# Patient Record
Sex: Male | Born: 1990 | Race: Black or African American | Hispanic: No | Marital: Single | Smoking: Current every day smoker
Health system: Southern US, Community
[De-identification: ages and names within clinical notes are randomized; demographics above are authoritative.]

## PROBLEM LIST (undated history)

## (undated) DIAGNOSIS — J45909 Unspecified asthma, uncomplicated: Secondary | ICD-10-CM

## (undated) HISTORY — PX: TONSILLECTOMY: SUR1361

---

## 2014-06-30 ENCOUNTER — Encounter (HOSPITAL_COMMUNITY): Payer: Self-pay | Admitting: Emergency Medicine

## 2014-06-30 ENCOUNTER — Emergency Department (HOSPITAL_COMMUNITY)
Admission: EM | Admit: 2014-06-30 | Discharge: 2014-06-30 | Disposition: A | Discharge status: Home / Self Care | Payer: BLUE CROSS/BLUE SHIELD | Source: Home / Self Care | Attending: Emergency Medicine | Admitting: Emergency Medicine

## 2014-06-30 DIAGNOSIS — A084 Viral intestinal infection, unspecified: Secondary | ICD-10-CM | POA: Diagnosis not present

## 2014-06-30 HISTORY — DX: Unspecified asthma, uncomplicated: J45.909

## 2014-06-30 MED ORDER — ONDANSETRON HCL 4 MG PO TABS: 4.0000 mg | ORAL_TABLET | Freq: Four times a day (QID) | ORAL | Status: AC

## 2014-06-30 NOTE — ED Notes (Signed)
Pt has been suffering from N&V and diarrhea since yesterday afternoon.  He states he is not able to keep any food down at all.  He also reports chills even in the heat.

## 2014-06-30 NOTE — Discharge Instructions (Signed)
Viral Gastroenteritis Pedialyte in small amounts frequently One or 2 Immodium AD per day if having 4-6 stools per 6 hour period Viral gastroenteritis is also called stomach flu. This illness is caused by a certain type of germ (virus). It can cause sudden watery poop (diarrhea) and throwing up (vomiting). This can cause you to lose body fluids (dehydration). This illness usually lasts for 3 to 8 days. It usually goes away on its own. HOME CARE   Drink enough fluids to keep your pee (urine) clear or pale yellow. Drink small amounts of fluids often.  Ask your doctor how to replace body fluid losses (rehydration).  Avoid:  Foods high in sugar.  Alcohol.  Bubbly (carbonated) drinks.  Tobacco.  Juice.  Caffeine drinks.  Very hot or cold fluids.  Fatty, greasy foods.  Eating too much at one time.  Dairy products until 24 to 48 hours after your watery poop stops.  You may eat foods with active cultures (probiotics). They can be found in some yogurts and supplements.  Wash your hands well to avoid spreading the illness.  Only take medicines as told by your doctor. Do not give aspirin to children. Do not take medicines for watery poop (antidiarrheals).  Ask your doctor if you should keep taking your regular medicines.  Keep all doctor visits as told. GET HELP RIGHT AWAY IF:   You cannot keep fluids down.  You do not pee at least once every 6 to 8 hours.  You are short of breath.  You see blood in your poop or throw up. This may look like coffee grounds.  You have belly (abdominal) pain that gets worse or is just in one small spot (localized).  You keep throwing up or having watery poop.  You have a fever.  The patient is a child younger than 3 months, and he or she has a fever.  The patient is a child older than 3 months, and he or she has a fever and problems that do not go away.  The patient is a child older than 3 months, and he or she has a fever and problems  that suddenly get worse.  The patient is a baby, and he or she has no tears when crying. MAKE SURE YOU:   Understand these instructions.  Will watch your condition.  Will get help right away if you are not doing well or get worse. Document Released: 08/16/2007 Document Revised: 05/22/2011 Document Reviewed: 12/14/2010 [facility] Patient Information 2015 [place], [place]. This information is not intended to replace advice given to you by your health care provider. Make sure you discuss any questions you have with your health care provider.  Food Choices to Help Relieve Diarrhea After no more vomiting. When you have diarrhea, the foods you eat and your eating habits are very important. Choosing the right foods and drinks can help relieve diarrhea. Also, because diarrhea can last up to 7 days, you need to replace lost fluids and electrolytes (such as sodium, potassium, and chloride) in order to help prevent dehydration.  WHAT GENERAL GUIDELINES DO I NEED TO FOLLOW?  Slowly drink 1 cup (8 oz) of fluid for each episode of diarrhea. If you are getting enough fluid, your urine will be clear or pale yellow.  Eat starchy foods. Some good choices include white rice, white toast, pasta, low-fiber cereal, baked potatoes (without the skin), saltine crackers, and bagels.  Avoid large servings of any cooked vegetables.  Limit fruit to two servings per  day. A serving is  cup or 1 small piece.  Choose foods with less than 2 g of fiber per serving.  Limit fats to less than 8 tsp (38 g) per day.  Avoid fried foods.  Eat foods that have probiotics in them. Probiotics can be found in certain dairy products.  Avoid foods and beverages that may increase the speed at which food moves through the stomach and intestines (gastrointestinal tract). Things to avoid include:  High-fiber foods, such as dried fruit, raw fruits and vegetables, nuts, seeds, and whole grain foods.  Spicy foods and high-fat  foods.  Foods and beverages sweetened with high-fructose corn syrup, honey, or sugar alcohols such as xylitol, sorbitol, and mannitol. WHAT FOODS ARE RECOMMENDED? Grains White rice. White, [place], or pita breads (fresh or toasted), including plain rolls, buns, or bagels. White pasta. Saltine, soda, or graham crackers. Pretzels. Low-fiber cereal. Cooked cereals made with water (such as cornmeal, farina, or cream cereals). Plain muffins. Matzo. Melba toast. Zwieback.  Vegetables Potatoes (without the skin). Strained tomato and vegetable juices. Most well-cooked and canned vegetables without seeds. Tender lettuce. Fruits Cooked or canned applesauce, apricots, cherries, fruit cocktail, grapefruit, peaches, pears, or plums. Fresh bananas, apples without skin, cherries, grapes, cantaloupe, grapefruit, peaches, oranges, or plums.  Meat and Other Protein Products Baked or boiled chicken. Eggs. Tofu. Fish. Seafood. Smooth peanut butter. Ground or well-cooked tender beef, ham, veal, lamb, pork, or poultry.  Dairy Plain yogurt, kefir, and unsweetened liquid yogurt. Lactose-free milk, buttermilk, or soy milk. Plain hard cheese. Beverages Sport drinks. Clear broths. Diluted fruit juices (except prune). Regular, caffeine-free sodas such as ginger ale. Water. Decaffeinated teas. Oral rehydration solutions. Sugar-free beverages not sweetened with sugar alcohols. Other Bouillon, broth, or soups made from recommended foods.  The items listed above may not be a complete list of recommended foods or beverages. Contact your dietitian for more options. WHAT FOODS ARE NOT RECOMMENDED? Grains Whole grain, whole wheat, bran, or rye breads, rolls, pastas, crackers, and cereals. Wild or brown rice. Cereals that contain more than 2 g of fiber per serving. Corn tortillas or taco shells. Cooked or dry oatmeal. Granola. Popcorn. Vegetables Raw vegetables. Cabbage, broccoli, Brussels sprouts, artichokes, baked beans, beet  greens, corn, kale, legumes, peas, sweet potatoes, and yams. Potato skins. Cooked spinach and cabbage. Fruits Dried fruit, including raisins and dates. Raw fruits. Stewed or dried prunes. Fresh apples with skin, apricots, mangoes, pears, raspberries, and strawberries.  Meat and Other Protein Products Chunky peanut butter. Nuts and seeds. Beans and lentils. Tomasa Blase.  Dairy High-fat cheeses. Milk, chocolate milk, and beverages made with milk, such as milk shakes. Cream. Ice cream. Sweets and Desserts Sweet rolls, doughnuts, and sweet breads. Pancakes and waffles. Fats and Oils Butter. Cream sauces. Margarine. Salad oils. Plain salad dressings. Olives. Avocados.  Beverages Caffeinated beverages (such as coffee, tea, soda, or energy drinks). Alcoholic beverages. Fruit juices with pulp. Prune juice. Soft drinks sweetened with high-fructose corn syrup or sugar alcohols. Other Coconut. Hot sauce. Chili powder. Mayonnaise. Gravy. Cream-based or milk-based soups.  The items listed above may not be a complete list of foods and beverages to avoid. Contact your dietitian for more information. WHAT SHOULD I DO IF I BECOME DEHYDRATED? Diarrhea can sometimes lead to dehydration. Signs of dehydration include dark urine and dry mouth and skin. If you think you are dehydrated, you should rehydrate with an oral rehydration solution. These solutions can be purchased at pharmacies, retail stores, or online.  Drink -1 cup (  120-240 mL) of oral rehydration solution each time you have an episode of diarrhea. If drinking this amount makes your diarrhea worse, try drinking smaller amounts more often. For example, drink 1-3 tsp (5-15 mL) every 5-10 minutes.  A general rule for staying hydrated is to drink 1-2 L of fluid per day. Talk to your health care provider about the specific amount you should be drinking each day. Drink enough fluids to keep your urine clear or pale yellow. Document Released: 05/20/2003 Document  Revised: 03/04/2013 Document Reviewed: 01/20/2013 [facility] Patient Information 2015 [place]. This information is not intended to replace advice given to you by your health care provider. Make sure you discuss any questions you have with your health care provider.

## 2014-06-30 NOTE — ED Provider Notes (Signed)
CSN: [id number]     Arrival date & time 06/30/14  1844 History   First MD Initiated Contact with Patient 06/30/14 1936     Chief Complaint  Patient presents with  . Nausea  . Vomiting  . Diarrhea  . Chills   (Consider location/radiation/quality/duration/timing/severity/associated sxs/prior Treatment) HPI Comments: 24 year old male with developed diarrhea, nausea and vomiting yesterday afternoon at 2 PM. It is associated with occasional chills and myalgias. He felt as though he had a fever but did not measure his temperature.   Past Medical History  Diagnosis Date  . Asthma    Past Surgical History  Procedure Laterality Date  . Tonsillectomy     History reviewed. No pertinent family history. History  Substance Use Topics  . Smoking status: Current Every Day Smoker -- 0.50 packs/day    Types: Cigarettes  . Smokeless tobacco: Never Used  . Alcohol Use: 4.2 oz/week    7 Cans of beer per week    Review of Systems  Constitutional: Positive for fever, activity change, appetite change and fatigue.  HENT: Positive for postnasal drip.   Respiratory: Negative for cough and shortness of breath.   Cardiovascular: Negative for chest pain.  Gastrointestinal:       As per history of present illness  Genitourinary: Negative.   Musculoskeletal: Positive for myalgias.  Skin: Negative for rash.  Neurological: Positive for light-headedness.    Allergies  Tomato  Home Medications   Prior to Admission medications   Medication Sig Start Date End Date Taking? Authorizing Provider  Loperamide HCl (IMODIUM PO) Take by mouth.   Yes Historical Provider, MD  ondansetron (ZOFRAN) 4 MG tablet Take 1 tablet (4 mg total) by mouth every 6 (six) hours. 06/30/14   Hayden Rasmussenavid Secret Kristensen, NP   BP 128/77 mmHg  Pulse 83  Temp(Src) 99 F (37.2 C) (Oral)  Resp 16  SpO2 96% Physical Exam  Constitutional: He is oriented to person, place, and time. He appears well-developed and well-nourished. No distress.   HENT:  Mouth/Throat: No oropharyngeal exudate.  Bilateral TMs are normal Oropharynx with mild erythema and cobblestoning with clear PND  Eyes: Conjunctivae and EOM are normal.  Neck: Normal range of motion. Neck supple.  Cardiovascular: Normal rate, regular rhythm, normal heart sounds and intact distal pulses.   Pulmonary/Chest: Effort normal and breath sounds normal. No respiratory distress. He has no wheezes. He has no rales.  Abdominal: Soft. Bowel sounds are normal. He exhibits no distension and no mass. There is no rebound and no guarding.  Mild tenderness in the left lower quadrant.  Lymphadenopathy:    He has no cervical adenopathy.  Neurological: He is alert and oriented to person, place, and time.  Skin: Skin is warm and dry.  Psychiatric: He has a normal mood and affect.  Nursing note and vitals reviewed.   ED Course  Procedures (including critical care time) Labs Review Labs Reviewed - No data to display  Imaging Review No results found.   MDM   1. Viral gastroenteritis    Pedialyte in small amounts frequently One or 2 Immodium AD per day if having 4-6 stools per 6 hour period Read diarrhea and vomiting instructions   Hayden Rasmussenavid Jariyah Hackley, NP 06/30/14 1956

## 2016-10-07 ENCOUNTER — Emergency Department (HOSPITAL_COMMUNITY)
Admission: EM | Admit: 2016-10-07 | Discharge: 2016-10-07 | Disposition: A | Discharge status: Home / Self Care | Payer: BLUE CROSS/BLUE SHIELD | Attending: Emergency Medicine | Admitting: Emergency Medicine

## 2016-10-07 ENCOUNTER — Encounter (HOSPITAL_COMMUNITY): Payer: Self-pay

## 2016-10-07 DIAGNOSIS — F17210 Nicotine dependence, cigarettes, uncomplicated: Secondary | ICD-10-CM | POA: Insufficient documentation

## 2016-10-07 DIAGNOSIS — Y929 Unspecified place or not applicable: Secondary | ICD-10-CM | POA: Insufficient documentation

## 2016-10-07 DIAGNOSIS — Y999 Unspecified external cause status: Secondary | ICD-10-CM | POA: Insufficient documentation

## 2016-10-07 DIAGNOSIS — W260XXA Contact with knife, initial encounter: Secondary | ICD-10-CM | POA: Insufficient documentation

## 2016-10-07 DIAGNOSIS — S61412A Laceration without foreign body of left hand, initial encounter: Secondary | ICD-10-CM | POA: Insufficient documentation

## 2016-10-07 DIAGNOSIS — Y9389 Activity, other specified: Secondary | ICD-10-CM | POA: Insufficient documentation

## 2016-10-07 DIAGNOSIS — J45909 Unspecified asthma, uncomplicated: Secondary | ICD-10-CM | POA: Insufficient documentation

## 2016-10-07 MED ORDER — CEPHALEXIN 250 MG PO CAPS
500.0000 mg | ORAL_CAPSULE | Freq: Once | ORAL | Status: AC
  Administered 2016-10-07: 500 mg via ORAL
  Filled 2016-10-07: qty 2

## 2016-10-07 MED ORDER — CEPHALEXIN 500 MG PO CAPS: 500.0000 mg | ORAL_CAPSULE | Freq: Four times a day (QID) | ORAL | 0 refills | Status: DC

## 2016-10-07 NOTE — Discharge Instructions (Signed)
Keep wound dry and do not remove dressing for 24 hours if possible. After that, wash gently morning and night (every 12 hours) with soap and water. Use a topical antibiotic ointment and cover with a bandaid or gauze.  °  °Do NOT use rubbing alcohol or hydrogen peroxide, do not soak the area °  °Every attempt was made to remove foreign body (contaminants) from the wound.  However, there is always a chance that some may remain in the wound. This can  increase your risk of infection. °  °If you see signs of infection (warmth, redness, tenderness, pus, sharp increase in pain, fever, red streaking in the skin) immediately return to the emergency department. °  °After the wound heals fully, apply sunscreen for 6-12 months to minimize scarring.  ° °
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
## 2017-07-29 ENCOUNTER — Emergency Department
Admission: EM | Admit: 2017-07-29 | Discharge: 2017-07-29 | Disposition: A | Discharge status: Home / Self Care | Payer: BLUE CROSS/BLUE SHIELD | Attending: Emergency Medicine | Admitting: Emergency Medicine

## 2017-07-29 ENCOUNTER — Emergency Department: Payer: BLUE CROSS/BLUE SHIELD

## 2017-07-29 ENCOUNTER — Other Ambulatory Visit: Payer: Self-pay

## 2017-07-29 DIAGNOSIS — S060X0A Concussion without loss of consciousness, initial encounter: Secondary | ICD-10-CM | POA: Diagnosis not present

## 2017-07-29 DIAGNOSIS — Y9389 Activity, other specified: Secondary | ICD-10-CM | POA: Insufficient documentation

## 2017-07-29 DIAGNOSIS — J45909 Unspecified asthma, uncomplicated: Secondary | ICD-10-CM | POA: Diagnosis not present

## 2017-07-29 DIAGNOSIS — S1091XA Abrasion of unspecified part of neck, initial encounter: Secondary | ICD-10-CM | POA: Diagnosis not present

## 2017-07-29 DIAGNOSIS — F17210 Nicotine dependence, cigarettes, uncomplicated: Secondary | ICD-10-CM | POA: Diagnosis not present

## 2017-07-29 DIAGNOSIS — Z23 Encounter for immunization: Secondary | ICD-10-CM | POA: Diagnosis not present

## 2017-07-29 DIAGNOSIS — Y998 Other external cause status: Secondary | ICD-10-CM | POA: Diagnosis not present

## 2017-07-29 DIAGNOSIS — Y92410 Unspecified street and highway as the place of occurrence of the external cause: Secondary | ICD-10-CM | POA: Diagnosis not present

## 2017-07-29 DIAGNOSIS — S0990XA Unspecified injury of head, initial encounter: Secondary | ICD-10-CM | POA: Diagnosis present

## 2017-07-29 DIAGNOSIS — S80211A Abrasion, right knee, initial encounter: Secondary | ICD-10-CM | POA: Insufficient documentation

## 2017-07-29 DIAGNOSIS — T07XXXA Unspecified multiple injuries, initial encounter: Secondary | ICD-10-CM

## 2017-07-29 MED ORDER — ACETAMINOPHEN 500 MG PO TABS
1000.0000 mg | ORAL_TABLET | Freq: Once | ORAL | Status: AC
  Administered 2017-07-29: 1000 mg via ORAL
  Filled 2017-07-29: qty 2

## 2017-07-29 MED ORDER — TETANUS-DIPHTH-ACELL PERTUSSIS 5-2.5-18.5 LF-MCG/0.5 IM SUSP
0.5000 mL | Freq: Once | INTRAMUSCULAR | Status: AC
  Administered 2017-07-29: 0.5 mL via INTRAMUSCULAR
  Filled 2017-07-29: qty 0.5

## 2017-07-29 MED ORDER — MORPHINE SULFATE (PF) 4 MG/ML IV SOLN
4.0000 mg | Freq: Once | INTRAVENOUS | Status: AC
  Administered 2017-07-29: 4 mg via INTRAVENOUS
  Filled 2017-07-29: qty 1

## 2017-07-29 MED ORDER — ONDANSETRON HCL 4 MG/2ML IJ SOLN
4.0000 mg | Freq: Once | INTRAMUSCULAR | Status: AC
  Administered 2017-07-29: 4 mg via INTRAVENOUS
  Filled 2017-07-29: qty 2

## 2017-07-29 MED ORDER — SODIUM CHLORIDE 0.9 % IV BOLUS
1000.0000 mL | Freq: Once | INTRAVENOUS | Status: AC
  Administered 2017-07-29: 1000 mL via INTRAVENOUS

## 2017-07-29 MED ORDER — OXYCODONE HCL 5 MG PO TABS
10.0000 mg | ORAL_TABLET | Freq: Once | ORAL | Status: AC
  Administered 2017-07-29: 10 mg via ORAL
  Filled 2017-07-29: qty 2

## 2017-07-29 MED ORDER — OXYCODONE-ACETAMINOPHEN 5-325 MG PO TABS: 1.0000 | ORAL_TABLET | ORAL | 0 refills | Status: AC | PRN
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
## 2017-07-29 NOTE — ED Notes (Signed)
Pt taken to CT at this time.

## 2017-07-29 NOTE — ED Notes (Signed)
C-collar applied by this RN
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
## 2017-07-31 ENCOUNTER — Encounter (HOSPITAL_COMMUNITY): Payer: Self-pay | Admitting: Family Medicine

## 2017-07-31 ENCOUNTER — Ambulatory Visit (HOSPITAL_COMMUNITY)
Admission: EM | Admit: 2017-07-31 | Discharge: 2017-07-31 | Disposition: A | Discharge status: Home / Self Care | Payer: Self-pay | Attending: Family Medicine | Admitting: Family Medicine

## 2017-07-31 DIAGNOSIS — M25512 Pain in left shoulder: Secondary | ICD-10-CM

## 2017-07-31 DIAGNOSIS — M25561 Pain in right knee: Secondary | ICD-10-CM

## 2017-07-31 DIAGNOSIS — M5431 Sciatica, right side: Secondary | ICD-10-CM

## 2017-07-31 MED ORDER — NAPROXEN 500 MG PO TABS: 500.0000 mg | ORAL_TABLET | Freq: Two times a day (BID) | ORAL | 0 refills | Status: AC
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
## 2017-07-31 NOTE — Discharge Instructions (Addendum)
Ice, elevation of knee, use of knee sleeve with activity. Naproxen twice a day, take with food. Do not take additional ibuprofen.  Activity as tolerated.  See shoulder Rom exercises.  Please establish with a primary care provider for follow up as may take some time, weeks even , for symptoms to resolve.
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
## 2018-03-02 ENCOUNTER — Ambulatory Visit (HOSPITAL_COMMUNITY)
Admission: EM | Admit: 2018-03-02 | Discharge: 2018-03-02 | Disposition: A | Discharge status: Home / Self Care | Payer: BLUE CROSS/BLUE SHIELD | Attending: Family Medicine | Admitting: Family Medicine

## 2018-03-02 ENCOUNTER — Other Ambulatory Visit: Payer: Self-pay

## 2018-03-02 ENCOUNTER — Encounter (HOSPITAL_COMMUNITY): Payer: Self-pay

## 2018-03-02 DIAGNOSIS — Z7251 High risk heterosexual behavior: Secondary | ICD-10-CM | POA: Insufficient documentation

## 2018-03-02 DIAGNOSIS — F17210 Nicotine dependence, cigarettes, uncomplicated: Secondary | ICD-10-CM | POA: Insufficient documentation

## 2018-03-02 DIAGNOSIS — Z91018 Allergy to other foods: Secondary | ICD-10-CM | POA: Insufficient documentation

## 2018-03-02 DIAGNOSIS — A630 Anogenital (venereal) warts: Secondary | ICD-10-CM | POA: Insufficient documentation

## 2018-03-02 DIAGNOSIS — Z113 Encounter for screening for infections with a predominantly sexual mode of transmission: Secondary | ICD-10-CM

## 2018-03-02 LAB — POCT URINALYSIS DIP (DEVICE)
Bilirubin Urine: NEGATIVE
Glucose, UA: NEGATIVE mg/dL
Hgb urine dipstick: NEGATIVE
Ketones, ur: NEGATIVE mg/dL
Leukocytes, UA: NEGATIVE
Nitrite: NEGATIVE
PROTEIN: NEGATIVE mg/dL
Specific Gravity, Urine: 1.02 (ref 1.005–1.030)
Urobilinogen, UA: 1 mg/dL (ref 0.0–1.0)
pH: 7 (ref 5.0–8.0)

## 2018-03-02 MED ORDER — IMIQUIMOD 3.75 % EX CREA: TOPICAL_CREAM | CUTANEOUS | 1 refills | Status: AC

## 2018-03-02 NOTE — ED Triage Notes (Signed)
Pt states he would like to be tested for STD. Pt states it burns when he voids. X 1 week

## 2018-03-02 NOTE — Discharge Instructions (Addendum)
We have sent testing for sexually transmitted infections. We will notify you of any positive results once they are received. If required, we will prescribe any medications you might need.  Please refrain from all sexual activity for at least the next seven days.  

## 2018-03-03 LAB — HIV ANTIBODY (ROUTINE TESTING W REFLEX): HIV Screen 4th Generation wRfx: NONREACTIVE

## 2018-03-03 LAB — RPR: RPR Ser Ql: NONREACTIVE

## 2018-03-04 LAB — URINE CYTOLOGY ANCILLARY ONLY
Chlamydia: NEGATIVE
Neisseria Gonorrhea: NEGATIVE
Trichomonas: NEGATIVE

## 2018-03-04 NOTE — ED Provider Notes (Signed)
[facility]   [id number] 03/02/18 Arrival Time: 1454  ASSESSMENT & PLAN:  1. Genital warts   2. Screening for STDs (sexually transmitted diseases)   3. High risk heterosexual behavior      Discharge Instructions     We have sent testing for sexually transmitted infections. We will notify you of any positive results once they are received. If required, we will prescribe any medications you might need.  Please refrain from all sexual activity for at least the next seven days.   Meds ordered this encounter  Medications  . Imiquimod 3.75 % CREA    Sig: Apply a thin layer once daily prior to bedtime. Wash off the next morning. Continue for up to 8 weeks maximum.    Dispense:  28 each    Refill:  1    Pending: Labs Reviewed  RPR  HIV ANTIBODY (ROUTINE TESTING W REFLEX)  POCT URINALYSIS DIP (DEVICE)  URINE CYTOLOGY ANCILLARY ONLY   Will notify of any positive results. Instructed to refrain from sexual activity for at least seven days.  Reviewed expectations re: course of current medical issues. Questions answered. Outlined signs and symptoms indicating need for more acute intervention. Patient verbalized understanding. After Visit Summary given.   SUBJECTIVE:  Bryan Vasquez is a 27 y.o. male who requests STD testing. No concern that he has anything. No penile discharge or urinary symptoms. Afebrile. No abdominal or pelvic pain. No n/v. No rashes or lesions. Sexually active with multiple male partners with condom use. Also concerned that he may have warts on his penis. Has noticed over the past few weeks. No pain. No ulcerations. No redness. No itching. OTC treatment: none reported. History of STI: none reported.  ROS: As per HPI. All other systems negative.   OBJECTIVE:  Vitals:   03/02/18 1616 03/02/18 1618  BP:  125/72  Pulse:  80  Resp:  18  Temp:  98.7 F (37.1 C)  SpO2:  100%  Weight: 90.7 kg      General appearance: alert, cooperative, appears  stated age and no distress Throat: lips, mucosa, and tongue normal; teeth and gums normal; no oral lesions Cv: RRR Lungs: CTAB Back: no CVA tenderness; FROM at waist Abdomen: soft, non-tender GU: 2-3 solitary smooths growths on penis; soft without bleeding; each about 2mm in size; skin colored; "these are definitely new" Skin: warm and dry Psychological: alert and cooperative; normal mood and affect.  Results for orders placed or performed during the hospital encounter of 03/02/18  RPR  Result Value Ref Range   RPR Ser Ql Non Reactive Non Reactive  HIV antibody  Result Value Ref Range   HIV Screen 4th Generation wRfx Non Reactive Non Reactive  POCT urinalysis dip (device)  Result Value Ref Range   Glucose, UA NEGATIVE NEGATIVE mg/dL   Bilirubin Urine NEGATIVE NEGATIVE   Ketones, ur NEGATIVE NEGATIVE mg/dL   Specific Gravity, Urine 1.020 1.005 - 1.030   Hgb urine dipstick NEGATIVE NEGATIVE   pH 7.0 5.0 - 8.0   Protein, ur NEGATIVE NEGATIVE mg/dL   Urobilinogen, UA 1.0 0.0 - 1.0 mg/dL   Nitrite NEGATIVE NEGATIVE   Leukocytes, UA NEGATIVE NEGATIVE    Labs Reviewed  RPR  HIV ANTIBODY (ROUTINE TESTING W REFLEX)  POCT URINALYSIS DIP (DEVICE)  URINE CYTOLOGY ANCILLARY ONLY    Allergies  Allergen Reactions  . Tomato     Past Medical History:  Diagnosis Date  . Asthma     Social History  Socioeconomic History  . Marital status: Single    Spouse name: Not on file  . Number of children: Not on file  . Years of education: Not on file  . Highest education level: Not on file  Occupational History  . Not on file  Social Needs  . Financial resource strain: Not on file  . Food insecurity:    Worry: Not on file    Inability: Not on file  . Transportation needs:    Medical: Not on file    Non-medical: Not on file  Tobacco Use  . Smoking status: Current Every Day Smoker    Packs/day: 0.50    Types: Cigarettes  . Smokeless tobacco: Never Used  Substance and Sexual  Activity  . Alcohol use: Yes    Alcohol/week: 7.0 standard drinks    Types: 7 Cans of beer per week  . Drug use: No  . Sexual activity: Not on file  Lifestyle  . Physical activity:    Days per week: Not on file    Minutes per session: Not on file  . Stress: Not on file  Relationships  . Social connections:    Talks on phone: Not on file    Gets together: Not on file    Attends religious service: Not on file    Active member of club or organization: Not on file    Attends meetings of clubs or organizations: Not on file    Relationship status: Not on file  . Intimate partner violence:    Fear of current or ex partner: Not on file    Emotionally abused: Not on file    Physically abused: Not on file    Forced sexual activity: Not on file  Other Topics Concern  . Not on file  Social History Narrative  . Not on file          Mardella LaymanHagler, Anielle Headrick, MD 03/04/18 [phone]
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
## 2023-07-12 ENCOUNTER — Encounter (HOSPITAL_COMMUNITY): Payer: Self-pay

## 2023-07-12 ENCOUNTER — Emergency Department (HOSPITAL_COMMUNITY): Payer: Self-pay

## 2023-07-12 ENCOUNTER — Other Ambulatory Visit: Payer: Self-pay

## 2023-07-12 ENCOUNTER — Emergency Department (HOSPITAL_COMMUNITY)
Admission: EM | Admit: 2023-07-12 | Discharge: 2023-07-12 | Disposition: A | Discharge status: Home / Self Care | Payer: Self-pay | Attending: Emergency Medicine | Admitting: Emergency Medicine

## 2023-07-12 DIAGNOSIS — S76012A Strain of muscle, fascia and tendon of left hip, initial encounter: Secondary | ICD-10-CM | POA: Insufficient documentation

## 2023-07-12 DIAGNOSIS — S86912A Strain of unspecified muscle(s) and tendon(s) at lower leg level, left leg, initial encounter: Secondary | ICD-10-CM

## 2023-07-12 DIAGNOSIS — J45909 Unspecified asthma, uncomplicated: Secondary | ICD-10-CM | POA: Insufficient documentation

## 2023-07-12 DIAGNOSIS — W108XXA Fall (on) (from) other stairs and steps, initial encounter: Secondary | ICD-10-CM | POA: Insufficient documentation

## 2023-07-12 MED ORDER — OXYCODONE-ACETAMINOPHEN 5-325 MG PO TABS
1.0000 | ORAL_TABLET | Freq: Once | ORAL | Status: AC
  Administered 2023-07-12: 1 via ORAL
  Filled 2023-07-12: qty 1

## 2023-07-12 NOTE — ED Triage Notes (Signed)
 Pt had a mechanical fall down 8 steps after tripping over kid's toy; denies loc, denies hitting head; c/o L knee and thigh pain; landed on concrete; denies neck pain

## 2023-07-12 NOTE — ED Provider Notes (Signed)
 [place] EMERGENCY DEPARTMENT AT [facility] Provider Note   CSN: [id number] Arrival date & time: 07/12/23  1206     History  No chief complaint on file.   Bryan Vasquez is a 33 y.o. male.  33 year old male presents with left knee pain after fall down 8 steps landing on concrete. States his knee hit the railing and twisted as he fell. Did not hit head, no LOC, denies neck or back pain or any other injuries.        Home Medications Prior to Admission medications   Medication Sig Start Date End Date Taking? Authorizing Provider  Imiquimod  3.75 % CREA Apply a thin layer once daily prior to bedtime. Wash off the next morning. Continue for up to 8 weeks maximum. 03/02/18   Afton Albright, MD  naproxen  (NAPROSYN ) 500 MG tablet Take 1 tablet (500 mg total) by mouth 2 (two) times daily. 07/31/17   Burky, Natalie B, NP  ondansetron  (ZOFRAN ) 4 MG tablet Take 1 tablet (4 mg total) by mouth every 6 (six) hours. Patient not taking: Reported on 10/07/2016 06/30/14   [name], NP  oxyCODONE -acetaminophen  (PERCOCET) 5-325 MG tablet Take 1 tablet by mouth every 4 (four) hours as needed for severe pain. 07/29/17   [name], MD      Allergies    Tomato    Review of Systems   Review of Systems Negative except as per HPI Physical Exam Updated Vital Signs BP (!) 143/95 (BP Location: Right Arm)   Pulse (!) 110   Temp 98.8 F (37.1 C)   Resp 16   Ht 6' (1.829 m)   Wt 102.1 kg   SpO2 99%   BMI 30.52 kg/m  Physical Exam Vitals and nursing note reviewed.  Constitutional:      General: He is not in acute distress.    Appearance: He is well-developed. He is not diaphoretic.  HENT:     Head: Normocephalic and atraumatic.  Cardiovascular:     Pulses: Normal pulses.  Pulmonary:     Effort: Pulmonary effort is normal.  Musculoskeletal:     Cervical back: No tenderness or bony tenderness. No pain with movement. Normal range of motion.     Thoracic back: No tenderness or  bony tenderness.     Lumbar back: No tenderness or bony tenderness.     Right hip: Normal.     Left hip: Normal.     Right knee: Normal.     Left knee: Bony tenderness present. No crepitus. Decreased range of motion.     Right ankle: Normal.     Left ankle: Normal.     Comments: TTP left knee and mid thigh  Neurological:     Mental Status: He is alert and oriented to person, place, and time.  Psychiatric:        Behavior: Behavior normal.     ED Results / Procedures / Treatments   Labs (all labs ordered are listed, but only abnormal results are displayed) Labs Reviewed - No data to display  EKG None  Radiology DG Knee Complete 4 Views Left Result Date: 07/12/2023 CLINICAL DATA:  Left knee and thigh pain status post fall downstairs. EXAM: LEFT KNEE - COMPLETE 4+ VIEW; LEFT FEMUR 2 VIEWS COMPARISON:  None available FINDINGS: Left femur: No fracture, dislocation, or soft tissue abnormality. Left knee: No fracture, dislocation, or soft tissue abnormality. IMPRESSION: No acute radiographic abnormality of the left femur or knee. Electronically Signed  By: [name]  Mir M.D.   On: 07/12/2023 13:59   DG Femur Min 2 Views Left Result Date: 07/12/2023 CLINICAL DATA:  Left knee and thigh pain status post fall downstairs. EXAM: LEFT KNEE - COMPLETE 4+ VIEW; LEFT FEMUR 2 VIEWS COMPARISON:  None available FINDINGS: Left femur: No fracture, dislocation, or soft tissue abnormality. Left knee: No fracture, dislocation, or soft tissue abnormality. IMPRESSION: No acute radiographic abnormality of the left femur or knee. Electronically Signed   By: [name] M.D.   On: 07/12/2023 13:59    Procedures Procedures    Medications Ordered in ED Medications  oxyCODONE -acetaminophen  (PERCOCET/ROXICET) 5-325 MG per tablet 1 tablet (1 tablet Oral Given 07/12/23 1213)    ED Course/ Medical Decision Making/ A&P                                 Medical Decision Making Amount and/or Complexity of Data  Reviewed Radiology: ordered.  Risk Prescription drug management.   This patient presents to the ED for concern of left knee pain, this involves an extensive number of treatment options, and is a complaint that carries with it a high risk of complications and morbidity.  The differential diagnosis includes fracture, sprain, contusion, dislocation   Co morbidities that complicate the patient evaluation  asthma   Additional history obtained:  External records from outside source obtained and reviewed including no recent relevant records on file.   Imaging Studies ordered:  I ordered imaging studies including XR knee, XR femur  I independently visualized and interpreted imaging which showed no acute process/bony injury I agree with the radiologist interpretation    Problem List / ED Course / Critical interventions / Medication management  32 year old male presents with complaint of pain in his left knee after fall down 8 steps today.  No other injuries, unable to bear weight secondary to pain after the fall.  On exam, generalized knee tenderness without crepitus.  Pain limits exam.  Patient is provided with Percocet with some improvement in his pain.  X-rays of the knee and femur are negative for acute bony abnormality.  Plan is to place in knee immobilizer, provide crutches, advised weight-bear as tolerating.  Recommend ice for 20 to that time, Motrin and Tylenol  as instructed for pain.  Follow-up with orthopedics if not improving. I ordered medication including percocet  for pain  Reevaluation of the patient after these medicines showed that the patient improved I have reviewed the patients home medicines and have made adjustments as needed   Social Determinants of Health:  No PCP on file   Test / Admission - Considered:  Stable for dc         Final Clinical Impression(s) / ED Diagnoses Final diagnoses:  Fall down stairs, initial encounter  Strain of left knee,  initial encounter    Rx / DC Orders ED Discharge Orders     None         [name] 07/12/23 1430    [name], MD 07/12/23 1916

## 2023-07-12 NOTE — Discharge Instructions (Signed)
 Home to rest. Weight bear as tolerated. Motrin and Tylenol  as needed as directed for pain. Apply ice over a thin cloth for 20 minutes at a time to help with pain and swelling.
